# Patient Record
Sex: Male | Born: 1990 | Race: White | Hispanic: No | Marital: Single | Smoking: Never smoker
Health system: Southern US, Community
[De-identification: ages and names within clinical notes are randomized; demographics above are authoritative.]

## PROBLEM LIST (undated history)

## (undated) DIAGNOSIS — R011 Cardiac murmur, unspecified: Secondary | ICD-10-CM

## (undated) HISTORY — DX: Cardiac murmur, unspecified: R01.1

---
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
## 2011-01-19 ENCOUNTER — Encounter: Payer: Self-pay | Admitting: Internal Medicine

## 2011-01-19 NOTE — Progress Notes (Signed)
  Subjective:    Patient ID: Kathreen Cosier, male    DOB: 1990-09-23, 20 y.o.   MRN: [id number]  HPI Patient is a 20 year old white male who presents for followup of ADHD.  He has been on vyvanse and has tolerated the medicine well he reports no side effects but reports appropriate effects of the medication including increased concentration for his return to school this fall.     Review of Systems  Constitutional: Negative for fever and fatigue.  HENT: Negative for hearing loss, congestion, neck pain and postnasal drip.   Eyes: Negative for discharge, redness and visual disturbance.  Respiratory: Negative for cough, shortness of breath and wheezing.   Cardiovascular: Negative for leg swelling.  Gastrointestinal: Negative for abdominal pain, constipation and abdominal distention.  Genitourinary: Negative for urgency and frequency.  Musculoskeletal: Negative for joint swelling and arthralgias.  Skin: Negative for color change and rash.  Neurological: Negative for weakness and light-headedness.  Hematological: Negative for adenopathy.  Psychiatric/Behavioral: Negative for behavioral problems.   Past Medical History  Diagnosis Date  . Asthma     childhood  . Heart murmur     since birth   Past Surgical History  Procedure Date  . Appendectomy 2009    reports that he has never smoked. He has never used smokeless tobacco. He reports that he does not drink alcohol or use illicit drugs. family history includes Arthritis in his mother; Diabetes in his mother; Hyperlipidemia in his father and mother; and Hypertension in his father and mother. No Known Allergies     Objective:   Physical Exam  Constitutional: He appears well-developed and well-nourished.  HENT:  Head: Normocephalic and atraumatic.  Eyes: Conjunctivae are normal. Pupils are equal, round, and reactive to light.  Neck: Normal range of motion. Neck supple.  Cardiovascular: Normal rate and regular rhythm.     Pulmonary/Chest: Effort normal and breath sounds normal.  Abdominal: Soft. Bowel sounds are normal.          Assessment & Plan:  Short term monitoring of the effects of this drug is positive no weight loss or other side effects seen that would prohibit the use of this drug the patient is doing well the medications a 90 day prescription will be given followup in 9 days

## 2011-03-24 ENCOUNTER — Ambulatory Visit: Payer: 59 | Admitting: Internal Medicine

## 2011-03-31 ENCOUNTER — Ambulatory Visit (INDEPENDENT_AMBULATORY_CARE_PROVIDER_SITE_OTHER): Payer: PRIVATE HEALTH INSURANCE | Admitting: Internal Medicine

## 2011-03-31 VITALS — BP 130/76 | HR 72 | Temp 98.2°F | Resp 16 | Ht 70.0 in | Wt 150.0 lb

## 2011-03-31 DIAGNOSIS — F909 Attention-deficit hyperactivity disorder, unspecified type: Secondary | ICD-10-CM

## 2011-03-31 MED ORDER — LISDEXAMFETAMINE DIMESYLATE 30 MG PO CAPS: 30.0000 mg | ORAL_CAPSULE | ORAL | Status: DC

## 2011-03-31 NOTE — Progress Notes (Signed)
  Subjective:    Patient ID: Carlos Mercado, male    DOB: 1991-01-12, 20 y.o.   MRN: [id number]  HPI Pt presents for refill of ADHD medications   Has been in school and medication is working well without side efects Review of Systems  Constitutional: Negative for fever and fatigue.  HENT: Negative for hearing loss, congestion, neck pain and postnasal drip.   Eyes: Negative for discharge, redness and visual disturbance.  Respiratory: Negative for cough, shortness of breath and wheezing.   Cardiovascular: Negative for leg swelling.  Gastrointestinal: Negative for abdominal pain, constipation and abdominal distention.  Genitourinary: Negative for urgency and frequency.  Musculoskeletal: Negative for joint swelling and arthralgias.  Skin: Negative for color change and rash.  Neurological: Negative for weakness and light-headedness.  Hematological: Negative for adenopathy.  Psychiatric/Behavioral: Negative for behavioral problems.   Past Medical History  Diagnosis Date  . Asthma     childhood  . Heart murmur     since birth    History   Social History  . Marital Status: Single    Spouse Name: N/A    Number of Children: N/A  . Years of Education: N/A   Occupational History  . [employer]    Social History Main Topics  . Smoking status: Never Smoker   . Smokeless tobacco: Never Used  . Alcohol Use: No  . Drug Use: No  . Sexually Active: Yes    Birth Control/ Protection: Condom   Other Topics Concern  . Not on file   Social History Narrative  . No narrative on file    Past Surgical History  Procedure Date  . Appendectomy 2009    Family History  Problem Relation Age of Onset  . Hypertension Mother   . Hyperlipidemia Mother   . Diabetes Mother   . Arthritis Mother   . Hypertension Father   . Hyperlipidemia Father     No Known Allergies  No current outpatient prescriptions on file prior to visit.    BP 130/76  Pulse 72  Temp 98.2 F (36.8 C)   Resp 16  Ht 5\' 10"  (1.778 m)  Wt 150 lb (68.04 kg)  BMI 21.52 kg/m2      Objective   Physical Exam  Nursing note and vitals reviewed. Constitutional: He appears well-developed and well-nourished.  HENT:  Head: Normocephalic and atraumatic.  Eyes: Conjunctivae are normal. Pupils are equal, round, and reactive to light.  Neck: Normal range of motion. Neck supple.  Cardiovascular: Normal rate and regular rhythm.   Pulmonary/Chest: Effort normal and breath sounds normal.  Abdominal: Soft. Bowel sounds are normal.         Assessment & Plan:  Weight stable Medication dose appropriate 3 months rx given with follow up planned Continued use discussed with pt ans pt plans to continue school for now

## 2011-03-31 NOTE — Patient Instructions (Signed)
The patient is instructed to continue all medications as prescribed. Schedule followup with check out clerk upon leaving the clinic  

## 2011-05-14 ENCOUNTER — Encounter: Payer: Self-pay | Admitting: Internal Medicine

## 2011-07-05 ENCOUNTER — Encounter: Payer: Self-pay | Admitting: Internal Medicine

## 2011-07-05 ENCOUNTER — Ambulatory Visit (INDEPENDENT_AMBULATORY_CARE_PROVIDER_SITE_OTHER): Payer: PRIVATE HEALTH INSURANCE | Admitting: Internal Medicine

## 2011-07-05 VITALS — BP 110/66 | HR 64 | Temp 98.0°F | Resp 14 | Ht 70.0 in | Wt 156.0 lb

## 2011-07-05 DIAGNOSIS — F909 Attention-deficit hyperactivity disorder, unspecified type: Secondary | ICD-10-CM

## 2011-07-05 MED ORDER — LISDEXAMFETAMINE DIMESYLATE 30 MG PO CAPS: 30.0000 mg | ORAL_CAPSULE | Freq: Every day | ORAL | Status: DC

## 2011-07-05 NOTE — Patient Instructions (Signed)
The patient is instructed to continue all medications as prescribed. Schedule followup with check out clerk upon leaving the clinic  

## 2011-07-05 NOTE — Progress Notes (Signed)
  Subjective:    Patient ID: Carlos Mercado, male    DOB: 21-Mar-1991, 21 y.o.   MRN: [id number]  HPI  Patient is a 21 year old male who presents for followup and continued monitoring of ADHD and medication use.  He has been stable on the BiPAP set at 30 mg by mouth daily his weight is stable he states is functioning he is stable.  He is currently in school and the medication helps his focus and grades.    Review of Systems  Constitutional: Negative for fever and fatigue.  HENT: Negative for hearing loss, congestion, neck pain and postnasal drip.   Eyes: Negative for discharge, redness and visual disturbance.  Respiratory: Negative for cough, shortness of breath and wheezing.   Cardiovascular: Negative for leg swelling.  Gastrointestinal: Negative for abdominal pain, constipation and abdominal distention.  Genitourinary: Negative for urgency and frequency.  Musculoskeletal: Negative for joint swelling and arthralgias.  Skin: Negative for color change and rash.  Neurological: Negative for weakness and light-headedness.  Hematological: Negative for adenopathy.  Psychiatric/Behavioral: Negative for behavioral problems.       Objective:   Physical Exam  Nursing note and vitals reviewed. Constitutional: He appears well-developed and well-nourished.  HENT:  Head: Normocephalic and atraumatic.  Eyes: Conjunctivae are normal. Pupils are equal, round, and reactive to light.  Neck: Normal range of motion. Neck supple.  Cardiovascular: Normal rate and regular rhythm.   Pulmonary/Chest: Effort normal and breath sounds normal.  Abdominal: Soft. Bowel sounds are normal.          Assessment & Plan:  Stable for ADHD on current medications refill medications for 90 days followup in 90 days per ADHD protocol

## 2011-10-04 ENCOUNTER — Encounter: Payer: PRIVATE HEALTH INSURANCE | Admitting: Internal Medicine

## 2011-10-04 DIAGNOSIS — Z0289 Encounter for other administrative examinations: Secondary | ICD-10-CM

## 2011-10-06 ENCOUNTER — Encounter: Payer: Self-pay | Admitting: Family

## 2011-10-06 ENCOUNTER — Ambulatory Visit (INDEPENDENT_AMBULATORY_CARE_PROVIDER_SITE_OTHER): Payer: PRIVATE HEALTH INSURANCE | Admitting: Family

## 2011-10-06 VITALS — BP 98/74 | HR 86 | Wt 149.5 lb

## 2011-10-06 DIAGNOSIS — R634 Abnormal weight loss: Secondary | ICD-10-CM

## 2011-10-06 DIAGNOSIS — R630 Anorexia: Secondary | ICD-10-CM

## 2011-10-06 NOTE — Progress Notes (Signed)
Subjective:    Patient ID: Carlos Mercado, male    DOB: August 14, 1990, 21 y.o.   MRN: [id number]  HPI 21 year old white male, nonsmoker, patient of Dr. Lovell Sheehan is in with complaints of rapid weight loss. He has had weight fluctuations since August 2012. He discontinued Vyvanse in February 2013. Beginning in April, he's had about a 15 pound weight loss. He exercises and does weight lifting every day. Reports a decrease in his appetite has been taken vitamin supplements. He denies any changes in his energy level, no shortness of breath, palpitations, no blood in his stools, dark black stools, nausea, vomiting denies any recreational drug use or alcohol. No family history of any colon problems or hyperthyroidism.   Review of Systems  Constitutional: Negative.   Respiratory: Negative.   Cardiovascular: Negative.   Gastrointestinal: Negative.   Musculoskeletal: Negative.   Skin: Negative.   Neurological: Negative.   Hematological: Negative.   Psychiatric/Behavioral: Negative.    Past Medical History  Diagnosis Date  . Asthma     childhood  . Heart murmur     since birth    History   Social History  . Marital Status: Single    Spouse Name: N/A    Number of Children: N/A  . Years of Education: N/A   Occupational History  . [employer]    Social History Main Topics  . Smoking status: Never Smoker   . Smokeless tobacco: Never Used  . Alcohol Use: No  . Drug Use: No  . Sexually Active: Yes    Birth Control/ Protection: Condom   Other Topics Concern  . Not on file   Social History Narrative  . No narrative on file    Past Surgical History  Procedure Date  . Appendectomy 2009    Family History  Problem Relation Age of Onset  . Hypertension Mother   . Hyperlipidemia Mother   . Diabetes Mother   . Arthritis Mother   . Hypertension Father   . Hyperlipidemia Father     No Known Allergies  Current Outpatient Prescriptions on File Prior to Visit  Medication Sig  Dispense Refill  . lisdexamfetamine (VYVANSE) 30 MG capsule Take 1 capsule (30 mg total) by mouth daily.  30 capsule  0  . lisdexamfetamine (VYVANSE) 30 MG capsule Take 1 capsule (30 mg total) by mouth every morning.  30 capsule  0  . lisdexamfetamine (VYVANSE) 50 MG capsule Take 1 capsule (50 mg total) by mouth every morning.  30 capsule  0    BP 98/74  Pulse 86  Wt 149 lb 8 oz (67.813 kg)  SpO2 98%chart     Objective:   Physical Exam  Constitutional: He is oriented to person, place, and time. He appears well-developed and well-nourished.  HENT:  Right Ear: External ear normal.  Left Ear: External ear normal.  Nose: Nose normal.  Mouth/Throat: Oropharynx is clear and moist.  Neck: Normal range of motion. Neck supple.  Cardiovascular: Normal rate, regular rhythm and normal heart sounds.   Pulmonary/Chest: Effort normal and breath sounds normal.  Abdominal: Soft. Bowel sounds are normal.  Musculoskeletal: Normal range of motion.  Neurological: He is alert and oriented to person, place, and time.  Skin: Skin is warm and dry.  Psychiatric: He has a normal mood and affect.          Assessment & Plan:  Assessment: Weight loss, decreased appetite  Plan: We'll send CBC and TSH and notify patient pending results. In the  meantime advised him to keep up with his caloric intake and try to increase the frequency of his meals. We'll consider a nutrition supplement if he continues to have decreased appetite.

## 2011-10-25 ENCOUNTER — Telehealth: Payer: Self-pay | Admitting: Internal Medicine

## 2011-10-25 NOTE — Telephone Encounter (Signed)
Pt informed-all labs wnl- per dr Lovell Sheehan- if he continues to loose weight will need to come see dr Lovell Sheehan for possible vyvanse adjustment

## 2011-10-25 NOTE — Telephone Encounter (Signed)
Pt called req to get lab results. Pls call.  °

## 2011-10-29 ENCOUNTER — Encounter: Payer: Self-pay | Admitting: Internal Medicine

## 2011-11-11 ENCOUNTER — Emergency Department: Payer: Self-pay | Admitting: Internal Medicine

## 2012-02-09 NOTE — Progress Notes (Signed)
  Subjective:    Patient ID: Carlos Mercado, male    DOB: 05/05/1991, 21 y.o.   MRN: [id number]  HPI Discharge was opened in error   Review of Systems     Objective:   Physical Exam        Assessment & Plan:  The chart was opened in error this is a no-show patient

## 2012-03-13 ENCOUNTER — Ambulatory Visit (INDEPENDENT_AMBULATORY_CARE_PROVIDER_SITE_OTHER): Payer: 59 | Admitting: Internal Medicine

## 2012-03-13 ENCOUNTER — Encounter: Payer: Self-pay | Admitting: Internal Medicine

## 2012-03-13 VITALS — BP 110/70 | HR 72 | Temp 98.6°F | Resp 16 | Ht 70.0 in | Wt 164.0 lb

## 2012-03-13 DIAGNOSIS — N62 Hypertrophy of breast: Secondary | ICD-10-CM

## 2012-03-13 NOTE — Progress Notes (Signed)
  Subjective:    Patient ID: Carlos Mercado, male    DOB: 10-04-1990, 21 y.o.   MRN: [id number]  HPI  Patient is a 21 year old male who presents for not 9 his nipples bilaterally.  He had been taking a testosterone stimulating substances such from [facility] and had noticed increased breast tissue.    Review of Systems  Constitutional: Negative for fever and fatigue.  HENT: Negative for hearing loss, congestion, neck pain and postnasal drip.   Eyes: Negative for discharge, redness and visual disturbance.  Respiratory: Negative for cough, shortness of breath and wheezing.   Cardiovascular: Negative for leg swelling.  Gastrointestinal: Negative for abdominal pain, constipation and abdominal distention.  Genitourinary: Negative for urgency and frequency.  Musculoskeletal: Negative for joint swelling and arthralgias.  Skin: Negative for color change and rash.  Neurological: Negative for weakness and light-headedness.  Hematological: Negative for adenopathy.  Psychiatric/Behavioral: Negative for behavioral problems.       Objective:   Physical Exam  Nursing note and vitals reviewed. Constitutional: He appears well-developed and well-nourished.  HENT:  Head: Normocephalic and atraumatic.  Eyes: Conjunctivae normal are normal. Pupils are equal, round, and reactive to light.  Neck: Normal range of motion. Neck supple.  Cardiovascular: Normal rate and regular rhythm.   Pulmonary/Chest: Effort normal and breath sounds normal.  Abdominal: Soft. Bowel sounds are normal.  Skin:       Increased nipple size and tenderness no discrete masses spell          Assessment & Plan:  Discussed the risk of estrogenization with the use of a testosterone stimulant at age 5 and that this is caused by hypertrophy of the breast tissue. Cessation of testosterone stimulant traditional bodybuilding recommended

## 2012-07-28 DIAGNOSIS — Z0279 Encounter for issue of other medical certificate: Secondary | ICD-10-CM
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
## 2015-04-21 ENCOUNTER — Encounter: Payer: Self-pay | Admitting: Physician Assistant

## 2015-04-21 ENCOUNTER — Ambulatory Visit: Payer: Self-pay | Admitting: Physician Assistant

## 2015-04-21 VITALS — BP 130/70 | HR 79 | Temp 98.6°F

## 2015-04-21 DIAGNOSIS — J069 Acute upper respiratory infection, unspecified: Secondary | ICD-10-CM

## 2015-04-21 MED ORDER — BENZONATATE 200 MG PO CAPS: 200.0000 mg | ORAL_CAPSULE | Freq: Three times a day (TID) | ORAL | Status: DC | PRN

## 2015-04-21 MED ORDER — AZITHROMYCIN 250 MG PO TABS: ORAL_TABLET | ORAL | Status: DC

## 2015-04-21 NOTE — Progress Notes (Signed)
S: C/o cough and congestion for 3 days, low grade fever, chills, denies cp/sob, v/d; cough is sporadic and dry, denies body aches   O: PE: vitals wnl, nad, pt feels warm to touch, perrl eomi, normocephalic, tms dull, nasal mucosa red and swollen, throat injected, neck supple no lymph, lungs c t a, cv rrr, neuro intact, cough is dry  A:  Acute uri   P: zpack, tessalon perls, remain out of work until have not had a fever in 24h;  drink fluids, continue regular meds , use otc meds of choice, return if not improving in 5 days, return earlier if worsening

## 2021-07-09 DIAGNOSIS — F419 Anxiety disorder, unspecified: Secondary | ICD-10-CM | POA: Insufficient documentation

## 2021-07-09 DIAGNOSIS — E7800 Pure hypercholesterolemia, unspecified: Secondary | ICD-10-CM | POA: Insufficient documentation

## 2021-11-09 ENCOUNTER — Ambulatory Visit
Admission: RE | Admit: 2021-11-09 | Discharge: 2021-11-09 | Disposition: A | Discharge status: Home / Self Care | Payer: BC Managed Care – PPO | Source: Ambulatory Visit | Attending: Internal Medicine | Admitting: Internal Medicine

## 2021-11-09 VITALS — BP 128/85 | HR 108 | Temp 98.6°F | Resp 16

## 2021-11-09 DIAGNOSIS — B9789 Other viral agents as the cause of diseases classified elsewhere: Secondary | ICD-10-CM | POA: Diagnosis not present

## 2021-11-09 DIAGNOSIS — J019 Acute sinusitis, unspecified: Secondary | ICD-10-CM

## 2021-11-09 MED ORDER — FLUTICASONE PROPIONATE 50 MCG/ACT NA SUSP: 1.0000 | Freq: Every day | NASAL | 0 refills | Status: AC

## 2021-11-09 MED ORDER — BENZONATATE 100 MG PO CAPS: 100.0000 mg | ORAL_CAPSULE | Freq: Three times a day (TID) | ORAL | 0 refills | Status: AC

## 2021-11-09 MED ORDER — GUAIFENESIN ER 600 MG PO TB12: 600.0000 mg | ORAL_TABLET | Freq: Two times a day (BID) | ORAL | 0 refills | Status: AC | PRN

## 2021-11-11 NOTE — ED Provider Notes (Signed)
[facility]    CSN: [id number] Arrival date & time: 11/09/21  1228      History   Chief Complaint Chief Complaint  Patient presents with   Nasal Congestion    Entered by patient    HPI Carlos Mercado is a 31 y.o. male comes to the urgent care with 3 to 4-day history of nasal congestion, headache, postnasal drainage and cough.  Patient says symptoms started insidiously and has been persistent.  It is associated with some postnasal drainage.  Nasal discharge has been yellowish to sometimes green.  No fever or chills.  Patient took a dose of Sudafed this morning.  That helped with symptoms.  Patient tested negative for COVID-19 virus.  He used home COVID test.  No sick contacts.Marland Kitchen   HPI  Past Medical History:  Diagnosis Date   Asthma    childhood   Heart murmur    since birth    Patient Active Problem List   Diagnosis Date Noted   Anxiety 07/09/2021   Hypercholesterolemia 07/09/2021   ADHD (attention deficit hyperactivity disorder) 11/23/2010    Past Surgical History:  Procedure Laterality Date   APPENDECTOMY  2009       Home Medications    Prior to Admission medications   Medication Sig Start Date End Date Taking? Authorizing Provider  benzonatate (TESSALON) 100 MG capsule Take 1 capsule (100 mg total) by mouth every 8 (eight) hours. 11/09/21  Yes Hansford Hirt, Britta Mccreedy, MD  fluticasone (FLONASE) 50 MCG/ACT nasal spray Place 1 spray into both nostrils daily. 11/09/21  Yes Valente Fosberg, Britta Mccreedy, MD  guaiFENesin (MUCINEX) 600 MG 12 hr tablet Take 1 tablet (600 mg total) by mouth 2 (two) times daily as needed. 11/09/21  Yes Calene Paradiso, Britta Mccreedy, MD  escitalopram (LEXAPRO) 10 MG tablet Take 10 mg by mouth daily. 07/09/21   [provider]  rosuvastatin (CRESTOR) 20 MG tablet Take 20 mg by mouth at bedtime. 07/09/21   [provider]    Family History Family History  Problem Relation Age of Onset   Hypertension Mother    Hyperlipidemia Mother     Diabetes Mother    Arthritis Mother    Hypertension Father    Hyperlipidemia Father     Social History Social History   Tobacco Use   Smoking status: Never   Smokeless tobacco: Never  Vaping Use   Vaping Use: Never used  Substance Use Topics   Alcohol use: No   Drug use: No     Allergies   Patient has no known allergies.   Review of Systems Review of Systems As per HPI  Physical Exam Triage Vital Signs ED Triage Vitals  Enc Vitals Group     BP 11/09/21 1248 128/85     Pulse Rate 11/09/21 1248 (S) (!) 108     Resp 11/09/21 1248 16     Temp 11/09/21 1248 98.6 F (37 C)     Temp Source 11/09/21 1248 Oral     SpO2 11/09/21 1248 100 %     Weight --      Height --      Head Circumference --      Peak Flow --      Pain Score 11/09/21 1247 0     Pain Loc --      Pain Edu? --      Excl. in GC? --    No data found.  Updated Vital Signs BP 128/85 (BP Location: Left Arm)  Pulse (S) (!) 108 Comment: anxious  Temp 98.6 F (37 C) (Oral)   Resp 16   SpO2 100%   Visual Acuity Right Eye Distance:   Left Eye Distance:   Bilateral Distance:    Right Eye Near:   Left Eye Near:    Bilateral Near:     Physical Exam Vitals and nursing note reviewed.  Constitutional:      General: He is not in acute distress.    Appearance: He is not ill-appearing.  HENT:     Right Ear: Tympanic membrane normal.     Left Ear: Tympanic membrane normal.     Nose: Congestion present.     Mouth/Throat:     Mouth: Mucous membranes are moist.     Pharynx: No oropharyngeal exudate or posterior oropharyngeal erythema.  Eyes:     Extraocular Movements: Extraocular movements intact.     Pupils: Pupils are equal, round, and reactive to light.  Cardiovascular:     Rate and Rhythm: Normal rate and regular rhythm.     Pulses: Normal pulses.     Heart sounds: Normal heart sounds.  Musculoskeletal:        General: Normal range of motion.  Neurological:     Mental Status: He is alert.       UC Treatments / Results  Labs (all labs ordered are listed, but only abnormal results are displayed) Labs Reviewed - No data to display  EKG   Radiology No results found.  Procedures Procedures (including critical care time)  Medications Ordered in UC Medications - No data to display  Initial Impression / Assessment and Plan / UC Course  I have reviewed the triage vital signs and the nursing notes.  Pertinent labs & imaging results that were available during my care of the patient were reviewed by me and considered in my medical decision making (see chart for details).     1.  Acute viral pharyngitis: Saline nasal spray Tessalon Perles as needed for cough, Flonase, Mucinex as needed Maintain adequate hydration Humidifier use and vapor rub recommended Return to urgent care if symptoms worsen. Final Clinical Impressions(s) / UC Diagnoses   Final diagnoses:  Acute viral sinusitis     Discharge Instructions      Continue saline nasal spray Humidifier use and vapor rub use will help with nasal congestion Please use medications as prescribed Maintain adequate hydration. Return to urgent care if symptoms worsen   ED Prescriptions     Medication Sig Dispense Auth. Provider   benzonatate (TESSALON) 100 MG capsule Take 1 capsule (100 mg total) by mouth every 8 (eight) hours. 21 capsule Yumna Ebers, Britta Mccreedy, MD   fluticasone (FLONASE) 50 MCG/ACT nasal spray Place 1 spray into both nostrils daily. 16 g Merrilee Jansky, MD   guaiFENesin (MUCINEX) 600 MG 12 hr tablet Take 1 tablet (600 mg total) by mouth 2 (two) times daily as needed. 20 tablet Azaryah Oleksy, Britta Mccreedy, MD      PDMP not reviewed this encounter.   Merrilee Jansky, MD 11/11/21 1214
